# Patient Record
Sex: Male | Born: 1995 | Race: Black or African American | Hispanic: No | Marital: Single | Smoking: Never smoker
Health system: Southern US, Community
[De-identification: ages and names within clinical notes are randomized; demographics above are authoritative.]

---

## 2015-04-21 ENCOUNTER — Emergency Department (HOSPITAL_BASED_OUTPATIENT_CLINIC_OR_DEPARTMENT_OTHER)
Admission: EM | Admit: 2015-04-21 | Discharge: 2015-04-21 | Disposition: A | Discharge status: Home / Self Care | Payer: BLUE CROSS/BLUE SHIELD | Attending: Emergency Medicine | Admitting: Emergency Medicine

## 2015-04-21 ENCOUNTER — Encounter (HOSPITAL_BASED_OUTPATIENT_CLINIC_OR_DEPARTMENT_OTHER): Payer: Self-pay | Admitting: Emergency Medicine

## 2015-04-21 ENCOUNTER — Emergency Department (HOSPITAL_BASED_OUTPATIENT_CLINIC_OR_DEPARTMENT_OTHER): Payer: BLUE CROSS/BLUE SHIELD

## 2015-04-21 DIAGNOSIS — S63509A Unspecified sprain of unspecified wrist, initial encounter: Secondary | ICD-10-CM

## 2015-04-21 DIAGNOSIS — Y998 Other external cause status: Secondary | ICD-10-CM | POA: Diagnosis not present

## 2015-04-21 DIAGNOSIS — Y92321 Football field as the place of occurrence of the external cause: Secondary | ICD-10-CM | POA: Diagnosis not present

## 2015-04-21 DIAGNOSIS — S63501A Unspecified sprain of right wrist, initial encounter: Secondary | ICD-10-CM | POA: Diagnosis not present

## 2015-04-21 DIAGNOSIS — Y9361 Activity, american tackle football: Secondary | ICD-10-CM | POA: Insufficient documentation

## 2015-04-21 DIAGNOSIS — S63502A Unspecified sprain of left wrist, initial encounter: Secondary | ICD-10-CM | POA: Diagnosis not present

## 2015-04-21 DIAGNOSIS — W51XXXA Accidental striking against or bumped into by another person, initial encounter: Secondary | ICD-10-CM | POA: Insufficient documentation

## 2015-04-21 DIAGNOSIS — S6991XA Unspecified injury of right wrist, hand and finger(s), initial encounter: Secondary | ICD-10-CM | POA: Diagnosis present

## 2015-04-21 MED ORDER — NAPROXEN 500 MG PO TABS: 500.0000 mg | ORAL_TABLET | Freq: Two times a day (BID) | ORAL | Status: DC
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
## 2015-04-21 NOTE — Discharge Instructions (Signed)
Joint Sprain °A sprain is a tear or stretch in the ligaments that hold a joint together. Severe sprains may need as long as 3-6 weeks of immobilization and/or exercises to heal completely. Sprained joints should be rested and protected. If not, they can become unstable and prone to re-injury. Proper treatment can reduce your pain, shorten the period of disability, and reduce the risk of repeated injuries. °TREATMENT  °· Rest and elevate the injured joint to reduce pain and swelling. °· Apply ice packs to the injury for 20-30 minutes every 2-3 hours for the next 2-3 days. °· Keep the injury wrapped in a compression bandage or splint as long as the joint is painful or as instructed by your caregiver. °· Do not use the injured joint until it is completely healed to prevent re-injury and chronic instability. Follow the instructions of your caregiver. °· Long-term sprain management may require exercises and/or treatment by a physical therapist. Taping or special braces may help stabilize the joint until it is completely better. °SEEK MEDICAL CARE IF:  °· You develop increased pain or swelling of the joint. °· You develop increasing redness and warmth of the joint. °· You develop a fever. °· It becomes stiff. °· Your hand or foot gets cold or numb. °Document Released: 09/10/2004 Document Revised: 10/26/2011 Document Reviewed: 08/20/2008 °[employer]® Patient Information ©2015 [employer]. This information is not intended to replace advice given to you by your health care provider. Make sure you discuss any questions you have with your health care provider. ° °

## 2015-04-21 NOTE — ED Notes (Signed)
Returned from xray

## 2015-04-21 NOTE — ED Notes (Signed)
MD at bedside. 

## 2016-10-28 ENCOUNTER — Ambulatory Visit (HOSPITAL_COMMUNITY)
Admission: EM | Admit: 2016-10-28 | Discharge: 2016-10-28 | Disposition: A | Discharge status: Home / Self Care | Payer: BLUE CROSS/BLUE SHIELD | Attending: Family Medicine | Admitting: Family Medicine

## 2016-10-28 ENCOUNTER — Encounter (HOSPITAL_COMMUNITY): Payer: Self-pay | Admitting: Family Medicine

## 2016-10-28 ENCOUNTER — Ambulatory Visit (INDEPENDENT_AMBULATORY_CARE_PROVIDER_SITE_OTHER): Payer: BLUE CROSS/BLUE SHIELD

## 2016-10-28 DIAGNOSIS — S0033XA Contusion of nose, initial encounter: Secondary | ICD-10-CM

## 2016-10-28 MED ORDER — IBUPROFEN 600 MG PO TABS: 600.0000 mg | ORAL_TABLET | Freq: Four times a day (QID) | ORAL | 0 refills | Status: DC | PRN

## 2016-10-28 MED ORDER — IBUPROFEN 600 MG PO TABS: 600.0000 mg | ORAL_TABLET | Freq: Four times a day (QID) | ORAL | 0 refills | Status: AC | PRN

## 2016-10-28 MED ORDER — KETOROLAC TROMETHAMINE 30 MG/ML IJ SOLN
30.0000 mg | Freq: Once | INTRAMUSCULAR | Status: AC
  Administered 2016-10-28: 30 mg via INTRAMUSCULAR

## 2016-10-28 MED ORDER — KETOROLAC TROMETHAMINE 30 MG/ML IJ SOLN
INTRAMUSCULAR | Status: AC
  Filled 2016-10-28: qty 1

## 2016-10-28 MED ORDER — OXYMETAZOLINE HCL 0.05 % NA SOLN: 1.0000 | Freq: Two times a day (BID) | NASAL | 0 refills | Status: AC
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
## 2016-10-28 NOTE — Discharge Instructions (Signed)
-   Take Ibuprofen as needed for moderate-severe pain - Keep ice packs on the injury - Follow up with ENT in next 1-2 days

## 2018-05-21 IMAGING — DX DG NASAL BONES 3+V
3 series · 3 of 3 positions shown · non-contrast
Comparison: None.

CLINICAL DATA: Initial evaluation for acute trauma, pain.

EXAM:
NASAL BONES - 3+ VIEW

[[person_name]]
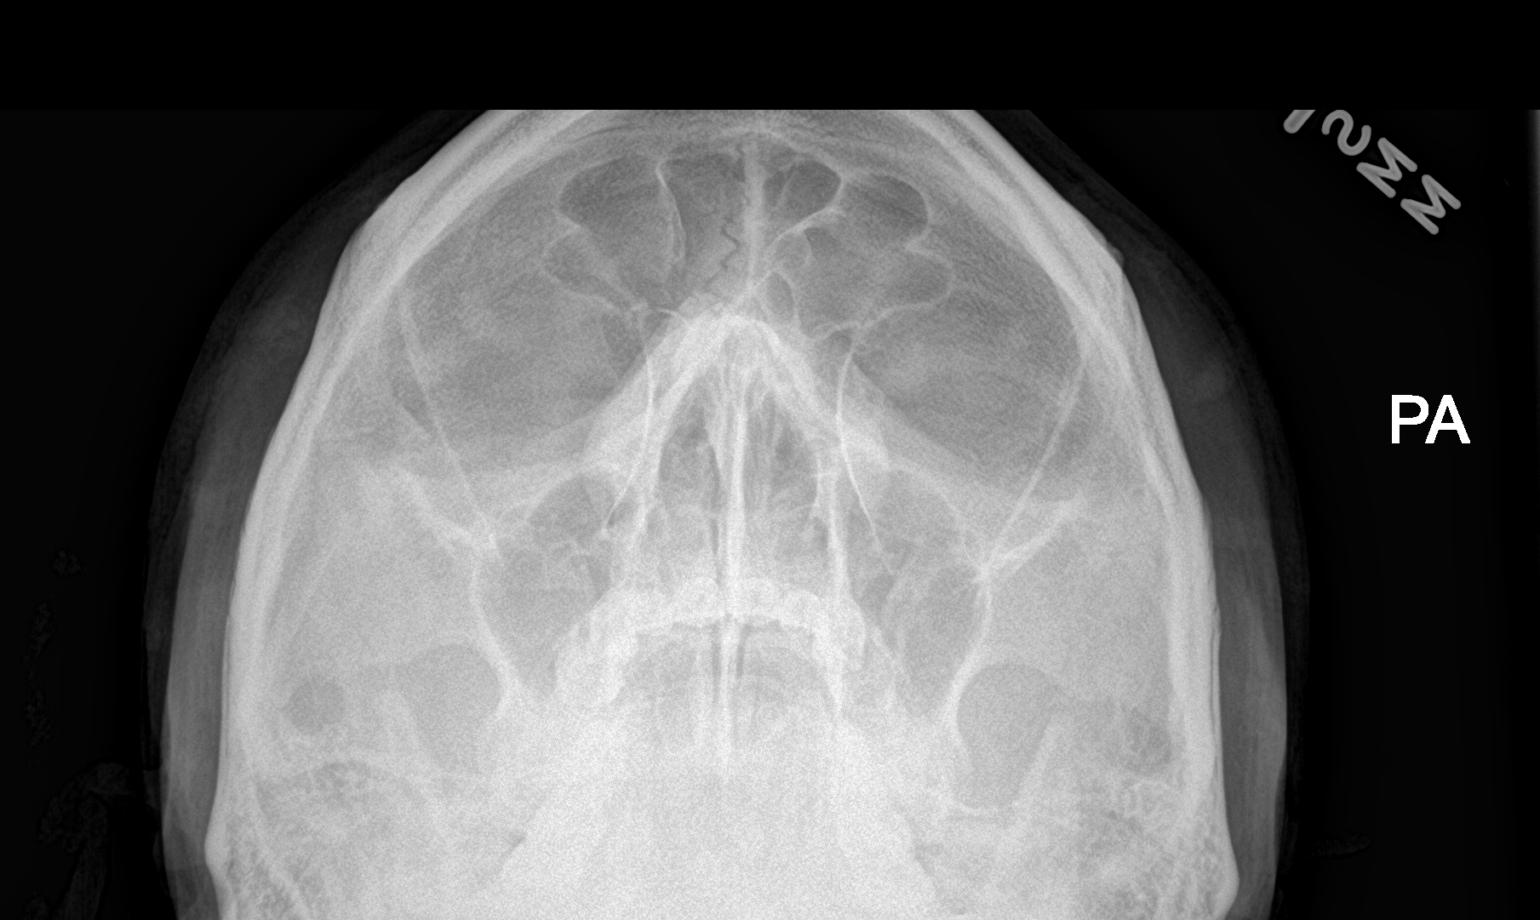

[nasal lat (1 of 2)]
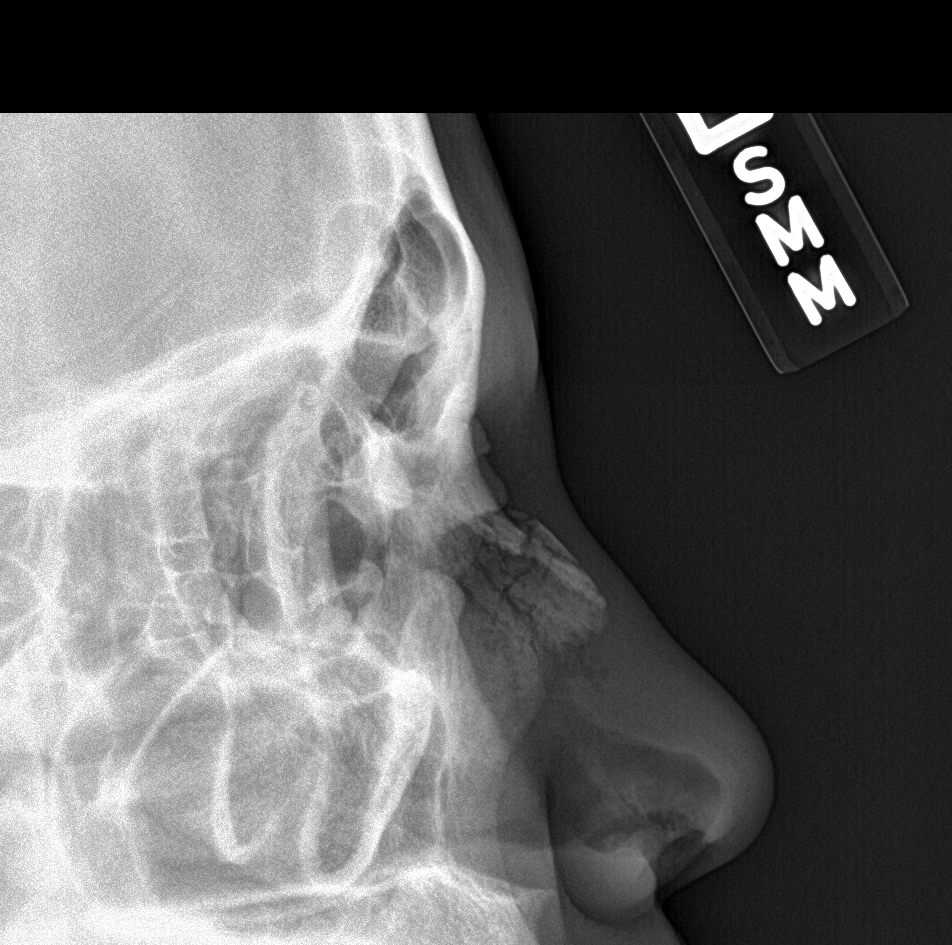

[nasal lat (2 of 2)]
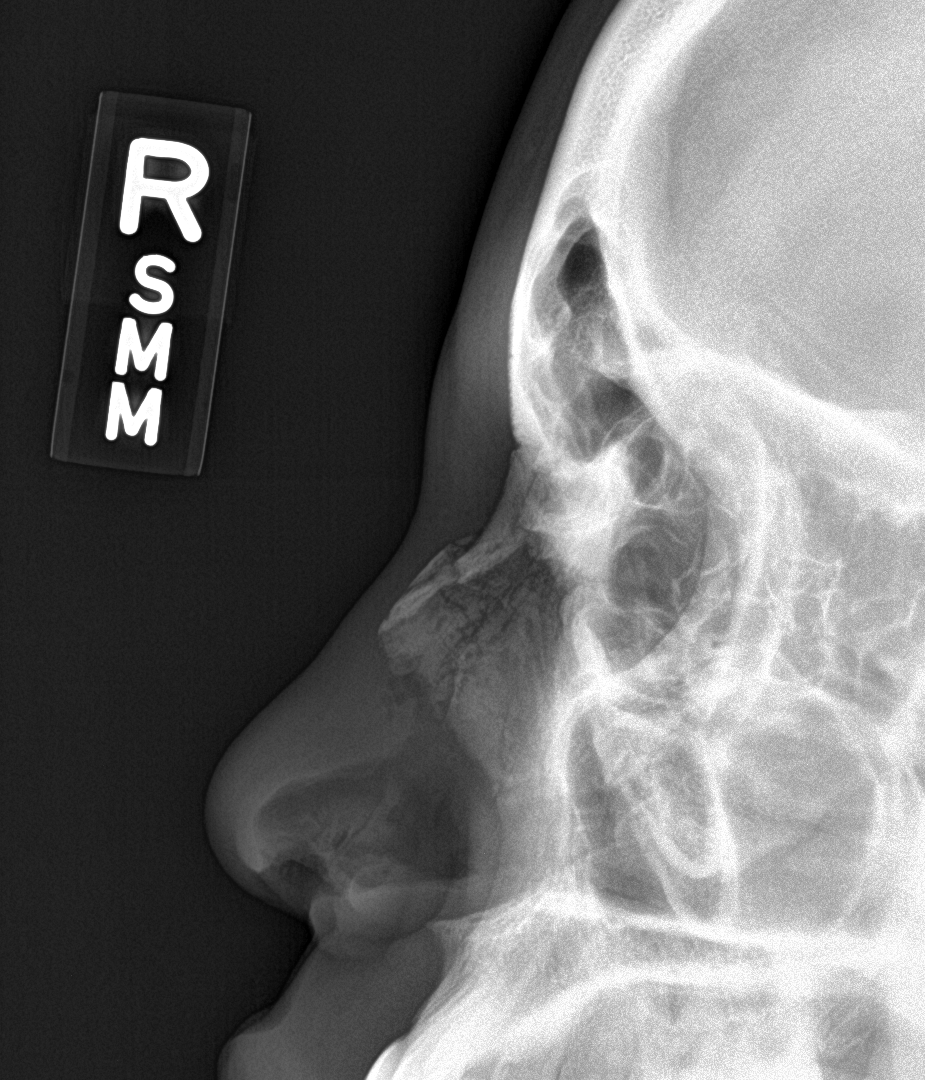

[3 of 3 positions shown; findings below may reference images not displayed]

FINDINGS: No definite acute displaced nasal bone fracture identified. Mild
osseous irregularity about the nasal bones appears chronic/
congenital in nature. No significant overlying soft tissue swelling.
Nasal septum is midline and grossly intact. Visualized bones of the
face otherwise intact.
IMPRESSION: No definite acute nasal bone fracture identified. If there is
continued high clinical suspicion for possible occult fracture,
follow-up examination with dedicated maxillofacial CT could be
performed for further evaluation as desired.
